# Patient Record
Sex: Male | Born: 1960 | Race: White | Hispanic: No | Marital: Single | Smoking: Former smoker
Health system: Southern US, Community
[De-identification: ages and names within clinical notes are randomized; demographics above are authoritative.]

## PROBLEM LIST (undated history)

## (undated) DIAGNOSIS — S83242D Other tear of medial meniscus, current injury, left knee, subsequent encounter: Secondary | ICD-10-CM

## (undated) DIAGNOSIS — Z789 Other specified health status: Secondary | ICD-10-CM

## (undated) HISTORY — PX: TONSILLECTOMY: SUR1361

## (undated) HISTORY — PX: KNEE CARTILAGE SURGERY: SHX688

---

## 1988-07-06 HISTORY — PX: SHOULDER ARTHROSCOPY: SHX128

## 1998-07-06 HISTORY — PX: APPENDECTOMY: SHX54

## 2003-06-30 ENCOUNTER — Emergency Department (HOSPITAL_COMMUNITY): Admission: EM | Admit: 2003-06-30 | Discharge: 2003-06-30 | Payer: Self-pay | Admitting: Emergency Medicine

## 2005-07-02 ENCOUNTER — Emergency Department (HOSPITAL_COMMUNITY): Admission: EM | Admit: 2005-07-02 | Discharge: 2005-07-02 | Payer: Self-pay | Admitting: Family Medicine

## 2011-11-13 ENCOUNTER — Other Ambulatory Visit: Payer: Self-pay | Admitting: Orthopedic Surgery

## 2011-12-03 ENCOUNTER — Encounter (HOSPITAL_BASED_OUTPATIENT_CLINIC_OR_DEPARTMENT_OTHER): Payer: Self-pay | Admitting: *Deleted

## 2011-12-03 NOTE — Progress Notes (Signed)
No meds-no ht or resp problems

## 2011-12-04 ENCOUNTER — Encounter (HOSPITAL_BASED_OUTPATIENT_CLINIC_OR_DEPARTMENT_OTHER): Payer: Self-pay | Admitting: Certified Registered"

## 2011-12-04 ENCOUNTER — Encounter (HOSPITAL_BASED_OUTPATIENT_CLINIC_OR_DEPARTMENT_OTHER): Payer: Self-pay

## 2011-12-04 ENCOUNTER — Encounter (HOSPITAL_BASED_OUTPATIENT_CLINIC_OR_DEPARTMENT_OTHER): Payer: Self-pay | Admitting: Orthopedic Surgery

## 2011-12-04 ENCOUNTER — Encounter (HOSPITAL_BASED_OUTPATIENT_CLINIC_OR_DEPARTMENT_OTHER)
Admission: RE | Disposition: A | Discharge status: Home / Self Care | Payer: Self-pay | Source: Ambulatory Visit | Attending: Orthopedic Surgery

## 2011-12-04 ENCOUNTER — Ambulatory Visit (HOSPITAL_BASED_OUTPATIENT_CLINIC_OR_DEPARTMENT_OTHER)
Admission: RE | Admit: 2011-12-04 | Discharge: 2011-12-04 | Disposition: A | Discharge status: Home / Self Care | Payer: 59 | Source: Ambulatory Visit | Attending: Orthopedic Surgery | Admitting: Orthopedic Surgery

## 2011-12-04 ENCOUNTER — Ambulatory Visit (HOSPITAL_BASED_OUTPATIENT_CLINIC_OR_DEPARTMENT_OTHER): Payer: 59 | Admitting: Certified Registered"

## 2011-12-04 DIAGNOSIS — M23205 Derangement of unspecified medial meniscus due to old tear or injury, unspecified knee: Secondary | ICD-10-CM | POA: Insufficient documentation

## 2011-12-04 DIAGNOSIS — S83242D Other tear of medial meniscus, current injury, left knee, subsequent encounter: Secondary | ICD-10-CM | POA: Diagnosis present

## 2011-12-04 HISTORY — DX: Other tear of medial meniscus, current injury, left knee, subsequent encounter: S83.242D

## 2011-12-04 HISTORY — DX: Other specified health status: Z78.9

## 2011-12-04 SURGERY — ARTHROSCOPY, KNEE, WITH MEDIAL MENISCECTOMY
Anesthesia: General | Site: Knee | Laterality: Left | Wound class: Clean

## 2011-12-04 MED ORDER — LACTATED RINGERS IV SOLN
INTRAVENOUS | Status: DC
  Administered 2011-12-04: 07:00:00 via INTRAVENOUS

## 2011-12-04 MED ORDER — OXYCODONE-ACETAMINOPHEN 5-325 MG PO TABS
1.0000 | ORAL_TABLET | Freq: Once | ORAL | Status: AC
  Administered 2011-12-04: 1 via ORAL

## 2011-12-04 MED ORDER — FENTANYL CITRATE 0.05 MG/ML IJ SOLN
INTRAMUSCULAR | Status: DC | PRN
  Administered 2011-12-04: 100 ug via INTRAVENOUS
  Administered 2011-12-04: 25 ug via INTRAVENOUS

## 2011-12-04 MED ORDER — HYDROMORPHONE HCL PF 1 MG/ML IJ SOLN
0.2500 mg | INTRAMUSCULAR | Status: DC | PRN
  Administered 2011-12-04 (×2): 0.25 mg via INTRAVENOUS

## 2011-12-04 MED ORDER — OXYCODONE-ACETAMINOPHEN 5-325 MG PO TABS: 1.0000 | ORAL_TABLET | Freq: Four times a day (QID) | ORAL | Status: AC | PRN

## 2011-12-04 MED ORDER — ONDANSETRON HCL 4 MG/2ML IJ SOLN: 4.0000 mg | Freq: Once | INTRAMUSCULAR | Status: DC | PRN

## 2011-12-04 MED ORDER — PROMETHAZINE HCL 25 MG PO TABS: 25.0000 mg | ORAL_TABLET | Freq: Four times a day (QID) | ORAL | Status: DC | PRN

## 2011-12-04 MED ORDER — SODIUM CHLORIDE 0.9 % IR SOLN
Status: DC | PRN
  Administered 2011-12-04: 6000 mL

## 2011-12-04 MED ORDER — PROPOFOL 10 MG/ML IV EMUL
INTRAVENOUS | Status: DC | PRN
  Administered 2011-12-04: 170 mg via INTRAVENOUS

## 2011-12-04 MED ORDER — DEXAMETHASONE SODIUM PHOSPHATE 4 MG/ML IJ SOLN
INTRAMUSCULAR | Status: DC | PRN
  Administered 2011-12-04: 8 mg via INTRAVENOUS

## 2011-12-04 MED ORDER — LIDOCAINE HCL (CARDIAC) 20 MG/ML IV SOLN
INTRAVENOUS | Status: DC | PRN
  Administered 2011-12-04: 80 mg via INTRAVENOUS

## 2011-12-04 MED ORDER — MIDAZOLAM HCL 5 MG/5ML IJ SOLN
INTRAMUSCULAR | Status: DC | PRN
  Administered 2011-12-04: 2 mg via INTRAVENOUS

## 2011-12-04 MED ORDER — OXYCODONE HCL 5 MG PO TABS: 5.0000 mg | ORAL_TABLET | Freq: Once | ORAL | Status: DC | PRN

## 2011-12-04 MED ORDER — KETOROLAC TROMETHAMINE 10 MG PO TABS: 10.0000 mg | ORAL_TABLET | Freq: Four times a day (QID) | ORAL | Status: AC | PRN

## 2011-12-04 MED ORDER — BUPIVACAINE HCL (PF) 0.5 % IJ SOLN
INTRAMUSCULAR | Status: DC | PRN
  Administered 2011-12-04: 20 mL via INTRA_ARTICULAR

## 2011-12-04 MED ORDER — ONDANSETRON HCL 4 MG/2ML IJ SOLN
INTRAMUSCULAR | Status: DC | PRN
  Administered 2011-12-04: 4 mg via INTRAVENOUS

## 2011-12-04 MED ORDER — CEFAZOLIN SODIUM 1-5 GM-% IV SOLN
1.0000 g | INTRAVENOUS | Status: AC
  Administered 2011-12-04: 1 g via INTRAVENOUS

## 2011-12-04 SURGICAL SUPPLY — 51 items
BANDAGE ELASTIC 6 VELCRO ST LF (GAUZE/BANDAGES/DRESSINGS) ×2 IMPLANT
BANDAGE ESMARK 6X9 LF (GAUZE/BANDAGES/DRESSINGS) IMPLANT
BENZOIN TINCTURE PRP APPL 2/3 (GAUZE/BANDAGES/DRESSINGS) ×2 IMPLANT
BLADE CUDA 5.5 (BLADE) IMPLANT
BLADE CUDA GRT WHITE 3.5 (BLADE) IMPLANT
BLADE CUDA SHAVER 3.5 (BLADE) IMPLANT
BLADE CUTTER GATOR 3.5 (BLADE) ×2 IMPLANT
BLADE CUTTER MENIS 5.5 (BLADE) IMPLANT
BLADE GREAT WHITE 4.2 (BLADE) IMPLANT
BNDG ESMARK 6X9 LF (GAUZE/BANDAGES/DRESSINGS)
CANISTER OMNI JUG 16 LITER (MISCELLANEOUS) ×2 IMPLANT
CANISTER SUCTION 2500CC (MISCELLANEOUS) IMPLANT
CLOTH BEACON ORANGE TIMEOUT ST (SAFETY) ×2 IMPLANT
CUFF TOURNIQUET SINGLE 34IN LL (TOURNIQUET CUFF) IMPLANT
CUTTER KNOT PUSHER 2-0 FIBERWI (INSTRUMENTS) IMPLANT
CUTTER MENISCUS  4.2MM (BLADE)
CUTTER MENISCUS 4.2MM (BLADE) IMPLANT
DRAPE ARTHROSCOPY W/POUCH 90 (DRAPES) ×2 IMPLANT
DRAPE SURG 17X23 STRL (DRAPES) ×2 IMPLANT
DURAPREP 26ML APPLICATOR (WOUND CARE) ×2 IMPLANT
ELECT MENISCUS 165MM 90D (ELECTRODE) IMPLANT
ELECT REM PT RETURN 9FT ADLT (ELECTROSURGICAL)
ELECTRODE REM PT RTRN 9FT ADLT (ELECTROSURGICAL) IMPLANT
GLOVE BIO SURGEON STRL SZ 6 (GLOVE) ×2 IMPLANT
GLOVE BIO SURGEON STRL SZ 6.5 (GLOVE) ×4 IMPLANT
GLOVE BIO SURGEON STRL SZ8 (GLOVE) ×2 IMPLANT
GLOVE BIOGEL PI IND STRL 7.0 (GLOVE) ×2 IMPLANT
GLOVE BIOGEL PI IND STRL 8 (GLOVE) ×2 IMPLANT
GLOVE BIOGEL PI INDICATOR 7.0 (GLOVE) ×2
GLOVE BIOGEL PI INDICATOR 8 (GLOVE) ×2
GLOVE INDICATOR 6.5 STRL GRN (GLOVE) ×2 IMPLANT
GLOVE ORTHO TXT STRL SZ7.5 (GLOVE) ×2 IMPLANT
GOWN PREVENTION PLUS XLARGE (GOWN DISPOSABLE) IMPLANT
GOWN PREVENTION PLUS XXLARGE (GOWN DISPOSABLE) IMPLANT
HOLDER KNEE FOAM BLUE (MISCELLANEOUS) ×2 IMPLANT
KNEE WRAP E Z 3 GEL PACK (MISCELLANEOUS) ×2 IMPLANT
NEEDLE MENISCAL REPAIR DBL ARM (NEEDLE) IMPLANT
NEEDLE MENISCAL REPAIR W/EYELT (NEEDLE) IMPLANT
PACK ARTHROSCOPY DSU (CUSTOM PROCEDURE TRAY) ×2 IMPLANT
PACK BASIN DAY SURGERY FS (CUSTOM PROCEDURE TRAY) ×2 IMPLANT
PENCIL BUTTON HOLSTER BLD 10FT (ELECTRODE) IMPLANT
SET ARTHROSCOPY TUBING (MISCELLANEOUS) ×1
SET ARTHROSCOPY TUBING LN (MISCELLANEOUS) ×1 IMPLANT
SLEEVE SCD COMPRESS KNEE MED (MISCELLANEOUS) ×2 IMPLANT
SPONGE GAUZE 4X4 12PLY (GAUZE/BANDAGES/DRESSINGS) ×2 IMPLANT
STRIP CLOSURE SKIN 1/2X4 (GAUZE/BANDAGES/DRESSINGS) ×2 IMPLANT
SUT MNCRL AB 4-0 PS2 18 (SUTURE) ×2 IMPLANT
TOWEL OR 17X24 6PK STRL BLUE (TOWEL DISPOSABLE) IMPLANT
TOWEL OR NON WOVEN STRL DISP B (DISPOSABLE) IMPLANT
WAND STAR VAC 90 (SURGICAL WAND) IMPLANT
WATER STERILE IRR 1000ML POUR (IV SOLUTION) ×2 IMPLANT

## 2011-12-04 NOTE — Anesthesia Procedure Notes (Addendum)
Performed by: Verlan Friends   Procedure Name: LMA Insertion Date/Time: 12/04/2011 7:41 AM Performed by: Verlan Friends Pre-anesthesia Checklist: Patient identified, Emergency Drugs available, Suction available, Patient being monitored and Timeout performed Patient Re-evaluated:Patient Re-evaluated prior to inductionOxygen Delivery Method: Circle System Utilized Preoxygenation: Pre-oxygenation with 100% oxygen Intubation Type: IV induction Ventilation: Mask ventilation without difficulty LMA: LMA inserted LMA Size: 4.0 Number of attempts: 1 Airway Equipment and Method: bite block Placement Confirmation: positive ETCO2 Tube secured with: Tape Dental Injury: Teeth and Oropharynx as per pre-operative assessment

## 2011-12-04 NOTE — Transfer of Care (Signed)
Immediate Anesthesia Transfer of Care Note  Patient: Adam Myers  Procedure(s) Performed: Procedure(s) (LRB): KNEE ARTHROSCOPY WITH MEDIAL MENISECTOMY (Left)  Patient Location: PACU  Anesthesia Type: General  Level of Consciousness: awake, alert , oriented and patient cooperative  Airway & Oxygen Therapy: Patient Spontanous Breathing and Patient connected to face mask oxygen  Post-op Assessment: Report given to PACU RN and Post -op Vital signs reviewed and stable  Post vital signs: Reviewed and stable  Complications: No apparent anesthesia complications

## 2011-12-04 NOTE — Anesthesia Preprocedure Evaluation (Signed)
Anesthesia Evaluation  Patient identified by MRN, date of birth, ID band Patient awake    Reviewed: Allergy & Precautions, H&P , NPO status , Patient's Chart, lab work & pertinent test results  Airway Mallampati: I      Dental  (+) Teeth Intact   Pulmonary  breath sounds clear to auscultation        Cardiovascular Rhythm:Regular Rate:Normal     Neuro/Psych    GI/Hepatic   Endo/Other    Renal/GU      Musculoskeletal   Abdominal   Peds  Hematology   Anesthesia Other Findings   Reproductive/Obstetrics                           Anesthesia Physical Anesthesia Plan  ASA: I  Anesthesia Plan: General   Post-op Pain Management:    Induction: Intravenous  Airway Management Planned: LMA  Additional Equipment:   Intra-op Plan:   Post-operative Plan:   Informed Consent: I have reviewed the patients History and Physical, chart, labs and discussed the procedure including the risks, benefits and alternatives for the proposed anesthesia with the patient or authorized representative who has indicated his/her understanding and acceptance.   Dental advisory given  Plan Discussed with:   Anesthesia Plan Comments: (Plan GA with LMA  Kipp Brood, MD)        Anesthesia Quick Evaluation

## 2011-12-04 NOTE — Discharge Instructions (Signed)
Meniscus Injury of the Knee, Arthroscopy You may have an internal derangement of the knee. This means something is wrong inside the knee. Your caregiver can make a more accurate diagnosis (learning what is wrong) by performing an arthroscopic procedure. Your knee has two layers of cartilage. Articular cartilage covers the bone ends. It lets your knee bend and move smoothly. Two menisci (thick pads of cartilage that form a rim inside the joint) help absorb shock. They stabilize your knee. Ligaments bind the bones together. They support your knee joint. Muscles move the joint, help support your knee, and take stress off the joint itself.  ABOUT THE PROCEDURE Arthroscopy is a surgical technique. It allows your orthopedic surgeon to diagnose and treat your knee injury with accuracy. The surgeon looks into your knee through a small scope. The scope is like a small (pencil-sized) telescope. Arthroscopy is less invasive than open knee surgery. You can expect a more rapid recovery. Following your caregiver's instructions will help you recover rapidly and completely. Use crutches, rest, elevate, ice, and do knee exercises as instructed. The length of recovery depends on various factors. These factors include type of injury, age, physical condition, medical conditions, and your determination. How long you will be away from your normal activities will depend on what kind of knee problem you have. It will also depend on how much tissue is damaged. Rebuilding your muscles after arthroscopy helps ensure a full recovery. RECOVERY Recovery after a meniscus injury depends on how much meniscus is damaged. It also depends on whether or not you have damaged other knee tissue. With small tears, your recovery may take a couple weeks. Larger tears will take longer. Meniscus injuries can usually be treated during arthroscopy. If your injury is on the inner edge of the meniscus, your surgeon may trim the meniscus back to a smooth rim.  In other cases, your surgeon will try to repair a damaged meniscus with sutures (stitches). This may lengthen your rehabilitation. It may provide better long-term health by helping your knee retain its shock absorption abilities. Use crutches, limit weight bearing, rest, elevate, apply ice, and exercise your knee as instructed. If a brace is applied, use as directed. The length of recovery depends on various factors including type of injury, age, physical condition, other medical conditions, and your determination. Your caregiver will help with instructions for rehabilitation of your knee. HOME CARE INSTRUCTIONS  Use crutches and knee exercises as instructed.   Applying an ice pack to your operative site may help with discomfort. It may also keep the swelling down.   Only take over-the-counter or prescription medicines for pain, discomfort, inflammation (soreness)or fever as directed by your caregiver. You may use these only if your caregiver has not given medications that would interfere.   You may resume normal diet and activities as directed.  SEEK MEDICAL ATTENTION IF:  There is increased bleeding (more than a small spot) from the wound.   You notice redness, swelling, or increasing pain in the wound.   Pus is coming from wound.   An unexplained oral temperature above 102 F (38.9 C) develops, or as your caregiver suggests.   You notice a foul smell coming from the wound or dressing.  SEEK IMMEDIATE MEDICAL CARE IF:  You develop a rash.   You have difficulty breathing.   You have any allergic problems.  Document Released: 06/19/2000 Document Revised: 06/11/2011 Document Reviewed: 09/05/2007 [facility] Patient Information 2012 [place], [place].   Post Anesthesia Home Care Instructions  Activity: Get plenty of rest for the remainder of the day. A responsible adult should stay with you for 24 hours following the procedure.  For the next 24 hours, DO NOT: -Drive a car -Social worker -Drink alcoholic beverages -Take any medication unless instructed by your physician -Make any legal decisions or sign important papers.  Meals: Start with liquid foods such as gelatin or soup. Progress to regular foods as tolerated. Avoid greasy, spicy, heavy foods. If nausea and/or vomiting occur, drink only clear liquids until the nausea and/or vomiting subsides. Call your physician if vomiting continues.  Special Instructions/Symptoms: Your throat may feel dry or sore from the anesthesia or the breathing tube placed in your throat during surgery. If this causes discomfort, gargle with warm salt water. The discomfort should disappear within 24 hours.

## 2011-12-04 NOTE — Op Note (Signed)
12/04/2011  8:34 AM  PATIENT:  Adam Myers    PRE-OPERATIVE DIAGNOSIS:  left knee medial meniscus tear  POST-OPERATIVE DIAGNOSIS:  Same  PROCEDURE:  KNEE ARTHROSCOPY WITH MEDIAL MENISECTOMY  SURGEON:  Eulas Post, MD  PHYSICIAN ASSISTANT: Janace Litten, OPA-C, present and scrubbed throughout the case, critical for completion in a timely fashion, and for retraction, instrumentation, and closure.  ANESTHESIA:   General  PREOPERATIVE INDICATIONS:  Adam Myers is a  51 y.o. male with a diagnosis of left knee medial meniscus tear who failed conservative measures and elected for surgical management.  He had a bucket handle locked meniscus tear. He had preoperative loss of extension.  The risks benefits and alternatives were discussed with the patient preoperatively including but not limited to the risks of infection, bleeding, nerve injury, cardiopulmonary complications, the need for revision surgery, among others, and the patient was willing to proceed.  OPERATIVE IMPLANTS: None  OPERATIVE FINDINGS: Bucket handle medial meniscus tear. This involved at least 50% of the meniscus. The patellofemoral joint was essentially normal. The medial and lateral condyles were normal on both the tibial and femoral side. The anterior cruciate ligament and PCL was intact. The medial and lateral gutters were normal. The lateral meniscus was normal.  OPERATIVE PROCEDURE: The patient was brought to the operating room and placed in the supine position. General anesthesia was administered. Time out was performed. The left lower extremity was prepped and draped in usual sterile fashion. Diagnostic arthroscopy was carried out the above-named findings. I used the arthroscopic biter to release the meniscus posteriorly, and then used a biter anteriorly after debriding the meniscus to a smaller size. I was then able to remove the meniscus through the medial portal. I did switch portals to facilitate access during  the meniscal release. I debrided the remnant portion of the meniscus back to a stable rim using the arthroscopic biter and the shaver.  After complete removal of the torn portion of the meniscus was achieved I completed the diagnostic arthroscopy and irrigated the knee copiously and drained the knee, injected it, and then closed the portals with Monocryl followed by Steri-Strips and sterile gauze. He was awakened and extubated and returned to the PACU in stable and satisfactory condition. There were no complications and he tolerated the procedure well.

## 2011-12-04 NOTE — Anesthesia Postprocedure Evaluation (Signed)
  Anesthesia Post-op Note  Patient: Adam Myers  Procedure(s) Performed: Procedure(s) (LRB): KNEE ARTHROSCOPY WITH MEDIAL MENISECTOMY (Left)  Patient Location: PACU  Anesthesia Type: General  Level of Consciousness: awake, alert  and oriented  Airway and Oxygen Therapy: Patient Spontanous Breathing and Patient connected to nasal cannula oxygen  Post-op Pain: mild  Post-op Assessment: Post-op Vital signs reviewed and Patient's Cardiovascular Status Stable  Post-op Vital Signs: stable  Complications: No apparent anesthesia complications

## 2011-12-04 NOTE — H&P (Signed)
  PREOPERATIVE H&P  Chief Complaint: left knee mmt  HPI: Adam Myers is a 51 y.o. male who presents for preoperative history and physical with a diagnosis of left knee medial meniscus tear. Symptoms are rated as moderate to severe, and have been worsening.  This is significantly impairing activities of daily living.  He has elected for surgical management.   Past Medical History  Diagnosis Date  . No pertinent past medical history    Past Surgical History  Procedure Date  . Shoulder arthroscopy 1990    right  . Tonsillectomy   . Appendectomy 2000   History   Social History  . Marital Status: Single    Spouse Name: N/A    Number of Children: N/A  . Years of Education: N/A   Social History Main Topics  . Smoking status: Former Smoker    Quit date: 12/02/2008  . Smokeless tobacco: None  . Alcohol Use: Yes     occ  . Drug Use: No  . Sexually Active:    Other Topics Concern  . None   Social History Narrative  . None   History reviewed. No pertinent family history. No Known Allergies Prior to Admission medications   Medication Sig Start Date End Date Taking? Authorizing Provider  fish oil-omega-3 fatty acids 1000 MG capsule Take 1 g by mouth daily.   Yes Historical Provider, MD     Positive ROS: All other systems have been reviewed and were otherwise negative with the exception of those mentioned in the HPI and as above.  Physical Exam: General: Alert, no acute distress Cardiovascular: No pedal edema Respiratory: No cyanosis, no use of accessory musculature GI: No organomegaly, abdomen is soft and non-tender Skin: No lesions in the area of chief complaint Neurologic: Sensation intact distally Psychiatric: Patient is competent for consent with normal mood and affect Lymphatic: No axillary or cervical lymphadenopathy  MUSCULOSKELETAL: left knee medial joint line tenderness.  Moderate effusion.  Assessment: left knee dislocation, mmt  Plan: Plan for  Procedure(s): KNEE ARTHROSCOPY WITH MEDIAL MENISECTOMY  The risks benefits and alternatives were discussed with the patient including but not limited to the risks of nonoperative treatment, versus surgical intervention including infection, bleeding, nerve injury,  blood clots, cardiopulmonary complications, morbidity, mortality, among others, and they were willing to proceed.   Eulas Post, MD 12/04/2011 7:25 AM

## 2012-06-10 ENCOUNTER — Ambulatory Visit (INDEPENDENT_AMBULATORY_CARE_PROVIDER_SITE_OTHER): Payer: 59 | Admitting: Family Medicine

## 2012-06-10 ENCOUNTER — Encounter: Payer: Self-pay | Admitting: Family Medicine

## 2012-06-10 VITALS — BP 110/79 | HR 68 | Temp 97.0°F | Resp 17 | Ht 66.5 in | Wt 163.2 lb

## 2012-06-10 DIAGNOSIS — Z23 Encounter for immunization: Secondary | ICD-10-CM

## 2012-06-10 DIAGNOSIS — Z0000 Encounter for general adult medical examination without abnormal findings: Secondary | ICD-10-CM

## 2012-06-10 LAB — CBC
HCT: 45.5 % (ref 39.0–52.0)
Hemoglobin: 15.6 g/dL (ref 13.0–17.0)
MCH: 31.5 pg (ref 26.0–34.0)
MCHC: 34.3 g/dL (ref 30.0–36.0)
MCV: 91.7 fL (ref 78.0–100.0)
Platelets: 325 10*3/uL (ref 150–400)
RBC: 4.96 MIL/uL (ref 4.22–5.81)
RDW: 13.9 % (ref 11.5–15.5)
WBC: 6.3 10*3/uL (ref 4.0–10.5)

## 2012-06-10 LAB — COMPREHENSIVE METABOLIC PANEL
ALT: 15 U/L (ref 0–53)
AST: 16 U/L (ref 0–37)
Albumin: 4.6 g/dL (ref 3.5–5.2)
Alkaline Phosphatase: 60 U/L (ref 39–117)
BUN: 11 mg/dL (ref 6–23)
CO2: 23 mEq/L (ref 19–32)
Calcium: 9.3 mg/dL (ref 8.4–10.5)
Chloride: 104 mEq/L (ref 96–112)
Creat: 0.89 mg/dL (ref 0.50–1.35)
Glucose, Bld: 81 mg/dL (ref 70–99)
Potassium: 4.1 mEq/L (ref 3.5–5.3)
Sodium: 136 mEq/L (ref 135–145)
Total Bilirubin: 0.4 mg/dL (ref 0.3–1.2)
Total Protein: 6.8 g/dL (ref 6.0–8.3)

## 2012-06-10 LAB — POCT URINALYSIS DIPSTICK
Bilirubin, UA: NEGATIVE
Blood, UA: NEGATIVE
Glucose, UA: NEGATIVE
Ketones, UA: NEGATIVE
Leukocytes, UA: NEGATIVE
Nitrite, UA: NEGATIVE
Protein, UA: NEGATIVE
Spec Grav, UA: 1.015
Urobilinogen, UA: 0.2
pH, UA: 5.5

## 2012-06-10 LAB — LIPID PANEL
Cholesterol: 155 mg/dL (ref 0–200)
HDL: 51 mg/dL (ref >39)
LDL Cholesterol: 96 mg/dL (ref 0–99)
Total CHOL/HDL Ratio: 3 Ratio
Triglycerides: 42 mg/dL (ref <150)
VLDL: 8 mg/dL (ref 0–40)

## 2012-06-10 LAB — PSA: PSA: 2 ng/mL (ref <=4.00)

## 2012-06-10 LAB — TSH: TSH: 1.951 u[IU]/mL (ref 0.350–4.500)

## 2012-06-10 LAB — IFOBT (OCCULT BLOOD): IFOBT: NEGATIVE

## 2012-06-10 NOTE — Progress Notes (Signed)
@[email or web address] @  Patient ID: Adam Myers MRN: [id number], DOB: 12/08/1960 51 y.o. Date of Encounter: 06/10/2012, 12:35 PM  Primary Physician: Elvina Sidle, MD  Chief Complaint: Physical (CPE)  HPI: 51 y.o. y/o male with history noted below here for CPE.  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 He has 2 boys age 88 and 16 cc on a regular basis, changing his flight schedule to accommodate the settlement with his ex.  Patient has had some fatigue, although given his twice a week flight schedule overseas in stress from his fracture, this may be all attributable.  Is been seeing Dr. 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 The injection seemed to be helping.  We discussed the use of aspirin in prevention of thromboembolic phenomenon when he is flying such long distances.  Last CPE 04/02/11 Has not rec'd flu shot this year  Review of Systems: Consitutional: No fever, chills, night sweats, lymphadenopathy, or weight changes. Eyes: No visual changes, eye redness, or discharge. ENT/Mouth: Ears: No otalgia, tinnitus, hearing loss, discharge. Nose: No congestion, rhinorrhea, sinus pain, or epistaxis. Throat: No sore throat, post nasal drip, or teeth pain. Cardiovascular: No CP, palpitations, diaphoresis, DOE, edema, orthopnea, PND. Respiratory: No cough, hemoptysis, SOB, or wheezing. Gastrointestinal: No anorexia, dysphagia, reflux, pain, nausea, vomiting, hematemesis, diarrhea, constipation, BRBPR, or melena. Genitourinary: No dysuria, frequency, urgency, hematuria, incontinence, nocturia, decreased urinary stream, discharge, impotence, or testicular pain/masses. Musculoskeletal: No decreased ROM, myalgias, stiffness, joint swelling, or weakness. Skin: No rash, erythema, lesion changes, pain, warmth, jaundice, or pruritis. Neurological: No headache, dizziness, syncope,  seizures, tremors, memory loss, coordination problems, or paresthesias. Psychological: No anxiety, depression, hallucinations, SI/HI. Endocrine: No fatigue, polydipsia, polyphagia, polyuria, or known diabetes. All other systems were reviewed and are otherwise negative.  Past Medical History  Diagnosis Date  . No pertinent past medical history   . Tear, knee, medial meniscus, left, subsequent encounter 12/04/2011     Past Surgical History  Procedure Date  . Shoulder arthroscopy 1990    right  . Tonsillectomy   . Appendectomy 2000    Home Meds:  Prior to Admission medications   Medication Sig Start Date End Date Taking? Authorizing Provider  fish oil-omega-3 fatty acids 1000 MG capsule Take 1 g by mouth daily.    Historical Provider, MD  promethazine (PHENERGAN) 25 MG tablet Take 1 tablet (25 mg total) by mouth every 6 (six) hours as needed for nausea. 12/04/11 12/11/11  Eulas Post, MD    Allergies: No Known Allergies  History   Social History  . Marital Status: Single    Spouse Name: N/A    Number of Children: N/A  . Years of Education: N/A   Occupational History  . Not on file.   Social History Main Topics  . Smoking status: Former Smoker -- 1.0 packs/day    Types: Cigarettes    Quit date: 12/02/2008  . Smokeless tobacco: Not on file     Comment: off and on quit years ago  . Alcohol Use: Yes     Comment: occ  . Drug Use: No  . Sexually Active: Not on file   Other Topics Concern  . Not on file   Social History Narrative  . No narrative on file    No family history on file.  Physical Exam: Blood pressure 110/79, pulse 68, temperature 97 F (36.1 C), temperature source Oral,  resp. rate 17, height 5' 6.5" (1.689 m), weight 163 lb 3.2 oz (74.027 kg).  General: Well developed, well nourished, in no acute distress. HEENT: Normocephalic, atraumatic. Conjunctiva pink, sclera non-icteric. Pupils 2 mm constricting to 1 mm, round, regular, and equally reactive to  light and accomodation. EOMI. Internal auditory canal clear. TMs with good cone of light and without pathology. Nasal mucosa pink. Nares are without discharge. No sinus tenderness. Oral mucosa pink. Dentition good . Pharynx without exudate.   Neck: Supple. Trachea midline. No thyromegaly. Full ROM. No lymphadenopathy. Lungs: Clear to auscultation bilaterally without wheezes, rales, or rhonchi. Breathing is of normal effort and unlabored. Cardiovascular: RRR with S1 S2. No murmurs, rubs, or gallops appreciated. Distal pulses 2+ symmetrically. No carotid or abdominal bruits Abdomen: Soft, non-tender, non-distended with normoactive bowel sounds. No hepatosplenomegaly or masses. No rebound/guarding. No CVA tenderness. Without hernias.  Rectal: No external hemorrhoids or fissures. Rectal vault without masses.  Genitourinary:  circumcised male. No penile lesions. Testes descended bilaterally, and smooth without tenderness or masses.  Musculoskeletal: Full range of motion and 5/5 strength throughout. Without swelling, atrophy, tenderness, crepitus, or warmth. Extremities without clubbing, cyanosis, or edema. Calves supple. Skin: Warm and moist without erythema, ecchymosis, wounds, or rash. Neuro: A+Ox3. CN II-XII grossly intact. Moves all extremities spontaneously. Full sensation throughout. Normal gait. DTR 2+ throughout upper and lower extremities. Finger to nose intact. Psych:  Responds to questions appropriately with a normal affect.    Assessment/Plan:  51 y.o. y/o  male here for CPE 1. Routine general medical examination at a health care facility  POCT urinalysis dipstick, Ambulatory referral to Gastroenterology, IFOBT POC (occult bld, rslt in office), CBC, Comprehensive metabolic panel, HIV antibody, Lipid panel, GC/chlamydia probe amp, urine, PSA, TSH, RPR  2. Needs flu shot  Flu vaccine greater than or equal to 3yo preservative free IM    -  Signed, Elvina Sidle, MD 06/10/2012 12:35  PM

## 2012-06-11 LAB — GC/CHLAMYDIA PROBE AMP, URINE
Chlamydia, Swab/Urine, PCR: NEGATIVE
GC Probe Amp, Urine: NEGATIVE

## 2012-06-11 LAB — HIV ANTIBODY (ROUTINE TESTING W REFLEX): HIV: NONREACTIVE

## 2012-06-11 LAB — RPR

## 2012-07-25 ENCOUNTER — Other Ambulatory Visit: Payer: Self-pay | Admitting: Gastroenterology

## 2012-08-04 ENCOUNTER — Encounter: Payer: Self-pay | Admitting: Family Medicine

## 2012-08-11 ENCOUNTER — Encounter (HOSPITAL_COMMUNITY): Payer: Self-pay | Admitting: Pharmacy Technician

## 2012-08-19 ENCOUNTER — Encounter (HOSPITAL_COMMUNITY): Admission: RE | Payer: Self-pay | Source: Ambulatory Visit

## 2012-08-19 ENCOUNTER — Ambulatory Visit (HOSPITAL_COMMUNITY): Admission: RE | Admit: 2012-08-19 | Payer: 59 | Source: Ambulatory Visit | Admitting: Gastroenterology

## 2012-08-19 SURGERY — COLONOSCOPY
Anesthesia: Moderate Sedation

## 2012-08-30 ENCOUNTER — Other Ambulatory Visit: Payer: Self-pay | Admitting: Gastroenterology

## 2012-09-27 ENCOUNTER — Other Ambulatory Visit: Payer: Self-pay | Admitting: Family Medicine

## 2012-09-27 ENCOUNTER — Telehealth: Payer: Self-pay

## 2012-09-27 NOTE — Telephone Encounter (Signed)
If you order it, I will sign it.  I tried to do a future order but I could not complete the order because I do not know when he can come in

## 2012-09-27 NOTE — Telephone Encounter (Signed)
DR L, PT NEEDS A TETANUS SHOT FOR [employer] REQUIREMENTS. YOU DID A CPE IN DEC. CAN YOU PUT IN AN ORDER FOR A TETANUS SO HE DOES NOT HAVE TO WAIT TO SEE A DR FOR THE SHOT?  PLEASE CONTACT PT IF THIS IS POSSIBLE SO HE WILL KNOW.  [phone]

## 2012-09-29 ENCOUNTER — Ambulatory Visit (INDEPENDENT_AMBULATORY_CARE_PROVIDER_SITE_OTHER): Payer: 59 | Admitting: Family Medicine

## 2012-09-29 ENCOUNTER — Other Ambulatory Visit: Payer: Self-pay

## 2012-09-29 VITALS — BP 108/78 | Temp 98.2°F

## 2012-09-29 DIAGNOSIS — Z23 Encounter for immunization: Secondary | ICD-10-CM

## 2012-09-29 NOTE — Progress Notes (Signed)
  Subjective:    Patient ID: Adam Myers, male    DOB: May 28, 1961, 52 y.o.   MRN: [id number]  HPI    Review of Systems     Objective:   Physical Exam        Assessment & Plan:

## 2012-09-29 NOTE — Progress Notes (Signed)
Patient here for Tdap only per Dr. Milus Glazier

## 2012-10-28 ENCOUNTER — Encounter (HOSPITAL_COMMUNITY)
Admission: RE | Disposition: A | Discharge status: Home / Self Care | Payer: Self-pay | Source: Ambulatory Visit | Attending: Gastroenterology

## 2012-10-28 ENCOUNTER — Ambulatory Visit (HOSPITAL_COMMUNITY)
Admission: RE | Admit: 2012-10-28 | Discharge: 2012-10-28 | Disposition: A | Discharge status: Home / Self Care | Payer: 59 | Source: Ambulatory Visit | Attending: Gastroenterology | Admitting: Gastroenterology

## 2012-10-28 ENCOUNTER — Encounter (HOSPITAL_COMMUNITY): Payer: Self-pay | Admitting: *Deleted

## 2012-10-28 DIAGNOSIS — D126 Benign neoplasm of colon, unspecified: Secondary | ICD-10-CM | POA: Insufficient documentation

## 2012-10-28 DIAGNOSIS — K648 Other hemorrhoids: Secondary | ICD-10-CM | POA: Insufficient documentation

## 2012-10-28 DIAGNOSIS — Z1211 Encounter for screening for malignant neoplasm of colon: Secondary | ICD-10-CM | POA: Insufficient documentation

## 2012-10-28 DIAGNOSIS — K5730 Diverticulosis of large intestine without perforation or abscess without bleeding: Secondary | ICD-10-CM | POA: Insufficient documentation

## 2012-10-28 DIAGNOSIS — K644 Residual hemorrhoidal skin tags: Secondary | ICD-10-CM | POA: Insufficient documentation

## 2012-10-28 HISTORY — PX: COLONOSCOPY: SHX5424

## 2012-10-28 SURGERY — COLONOSCOPY
Anesthesia: Moderate Sedation

## 2012-10-28 MED ORDER — DIPHENHYDRAMINE HCL 50 MG/ML IJ SOLN
INTRAMUSCULAR | Status: AC
  Filled 2012-10-28: qty 1

## 2012-10-28 MED ORDER — MIDAZOLAM HCL 5 MG/5ML IJ SOLN
INTRAMUSCULAR | Status: DC | PRN
  Administered 2012-10-28 (×4): 2.5 mg via INTRAVENOUS

## 2012-10-28 MED ORDER — SODIUM CHLORIDE 0.9 % IV SOLN
INTRAVENOUS | Status: DC
  Administered 2012-10-28: 500 mL via INTRAVENOUS

## 2012-10-28 MED ORDER — FENTANYL CITRATE 0.05 MG/ML IJ SOLN
INTRAMUSCULAR | Status: AC
  Filled 2012-10-28: qty 4

## 2012-10-28 MED ORDER — MIDAZOLAM HCL 10 MG/2ML IJ SOLN
INTRAMUSCULAR | Status: AC
  Filled 2012-10-28: qty 4

## 2012-10-28 MED ORDER — FENTANYL CITRATE 0.05 MG/ML IJ SOLN
INTRAMUSCULAR | Status: DC | PRN
  Administered 2012-10-28 (×4): 25 ug via INTRAVENOUS

## 2012-10-28 NOTE — Op Note (Signed)
[facility] [address] [place], [address]   OPERATIVE PROCEDURE REPORT  PATIENT: Adam, Myers  MR#: [id number] BIRTHDATE: [birth year]  GENDER: Male ENDOSCOPIST: Jeani Hawking, MD ASSISTANT:   Claudie Revering, RN [facility] [place]bird, technician PROCEDURE DATE: 10/28/2012 PROCEDURE:   Colonoscopy with snare polypectomy ASA CLASS:   Class I INDICATIONS:Colorectal cancer screening. MEDICATIONS: Versed 10 mg IV and Fentanyl 100 mcg IV  DESCRIPTION OF PROCEDURE:   After the risks benefits and alternatives of the procedure were thoroughly explained, informed consent was obtained.  A digital rectal exam revealed no abnormalities of the rectum.    The     endoscope was introduced through the anus  and advanced to the cecum, which was identified by both the appendix and ileocecal valve , No adverse events experienced.    The quality of the prep was excellent. .  The instrument was then slowly withdrawn as the colon was fully examined.     FINDINGS: A 4 mm sessile cecal polyp was removed with a cold snare. Three 3 mm sessile transverse colon polyps were removed with a cold snare.  Scattered sigmoid diverticula were identified. Retroflexed views revealed internal/external hemorrhoids.     The scope was then withdrawn from the patient and the procedure terminated.  COMPLICATIONS: There were no complications.  IMPRESSION: 1) Polyps. 2) Diverticula. 3) Int/Ext Hemorrhoids.  RECOMMENDATIONS: 1) Await biopsy results. 2) Repeat colonoscopy in 3-5 years.   _______________________________ eSignedJeani Hawking, MD 10/28/2012 10:55 AM

## 2012-10-28 NOTE — H&P (Signed)
Reason for Consult:Screening colonoscopy. Referring Physician: Elvina Sidle, M.D.  Adam Myers HPI: This is a 52 year old male referred for a screening colonoscopy.  The patient denies any problems with nausea, vomiting, fevers, chills, abdominal pain, diarrhea, constipation, hematochezia, melena, GERD, or dysphagia.  The patient denies any known family history of colon cancer.  Past Medical History  Diagnosis Date  . No pertinent past medical history   . Tear, knee, medial meniscus, left, subsequent encounter 12/04/2011    Past Surgical History  Procedure Laterality Date  . Shoulder arthroscopy  1990    right  . Tonsillectomy    . Appendectomy  2000  . Knee cartilage surgery      Lt knee    Family History  Problem Relation Age of Onset  . Heart disease Mother   . Emphysema Maternal Grandfather   . Emphysema Paternal Grandfather     Social History:  reports that he quit smoking about 3 years ago. His smoking use included Cigarettes. He smoked 1.00 pack per day. He does not have any smokeless tobacco history on file. He reports that  drinks alcohol. He reports that he does not use illicit drugs.  Allergies: No Known Allergies  Medications:  Scheduled:  Continuous: . sodium chloride 500 mL (10/28/12 0947)    No results found for this or any previous visit (from the past 24 hour(s)).   No results found.  ROS:  As stated above in the HPI otherwise negative.  Blood pressure 118/79, pulse 57, temperature 98.8 F (37.1 C), temperature source Oral, resp. rate 12, height 5' 6.5" (1.689 m), weight 154 lb (69.854 kg), SpO2 98.00%.    PE: Gen: NAD, Alert and Oriented HEENT:  [place]/AT, EOMI Neck: Supple, no LAD Lungs: CTA Bilaterally CV: RRR without M/G/R ABM: Soft, NTND, +BS Ext: No C/C/E  Assessment/Plan: 1) Screening colonoscopy.  Plan: 1) Colonoscopy now.  Katilin Raynes D 10/28/2012, 10:01 AM

## 2012-10-31 ENCOUNTER — Encounter (HOSPITAL_COMMUNITY): Payer: Self-pay | Admitting: Gastroenterology

## 2013-03-16 ENCOUNTER — Telehealth: Payer: Self-pay

## 2013-03-16 NOTE — Telephone Encounter (Signed)
Medical Records: patient needs a copy of his latest lab results mailed to him at your earliest convenience.  Thank you.  His number is [phone] if you have any questions.

## 2013-03-16 NOTE — Telephone Encounter (Signed)
Will you do this? I am sorry I would normally do this, but my computer wont print hopefully this will be fixed soon

## 2013-03-20 NOTE — Telephone Encounter (Signed)
Done and mailed

## 2013-05-11 ENCOUNTER — Other Ambulatory Visit: Payer: Self-pay

## 2013-06-22 ENCOUNTER — Encounter: Payer: Self-pay | Admitting: Family Medicine

## 2013-06-22 ENCOUNTER — Ambulatory Visit (INDEPENDENT_AMBULATORY_CARE_PROVIDER_SITE_OTHER): Payer: 59 | Admitting: Family Medicine

## 2013-06-22 VITALS — BP 116/76 | HR 65 | Temp 97.9°F | Resp 16

## 2013-06-22 DIAGNOSIS — H6691 Otitis media, unspecified, right ear: Secondary | ICD-10-CM

## 2013-06-22 DIAGNOSIS — H6690 Otitis media, unspecified, unspecified ear: Secondary | ICD-10-CM

## 2013-06-22 DIAGNOSIS — Z860100 Personal history of colon polyps, unspecified: Secondary | ICD-10-CM | POA: Insufficient documentation

## 2013-06-22 DIAGNOSIS — Z23 Encounter for immunization: Secondary | ICD-10-CM

## 2013-06-22 DIAGNOSIS — M720 Palmar fascial fibromatosis [Dupuytren]: Secondary | ICD-10-CM

## 2013-06-22 DIAGNOSIS — Z0000 Encounter for general adult medical examination without abnormal findings: Secondary | ICD-10-CM

## 2013-06-22 LAB — LIPID PANEL
Cholesterol: 145 mg/dL (ref 0–200)
HDL: 62 mg/dL (ref >39)
LDL Cholesterol: 76 mg/dL (ref 0–99)
Total CHOL/HDL Ratio: 2.3 Ratio
Triglycerides: 36 mg/dL (ref <150)
VLDL: 7 mg/dL (ref 0–40)

## 2013-06-22 LAB — POCT URINALYSIS DIPSTICK
Bilirubin, UA: NEGATIVE
Glucose, UA: NEGATIVE
Ketones, UA: 15
Leukocytes, UA: NEGATIVE
Nitrite, UA: NEGATIVE
Protein, UA: NEGATIVE
Spec Grav, UA: 1.02
Urobilinogen, UA: 0.2
pH, UA: 5

## 2013-06-22 LAB — COMPREHENSIVE METABOLIC PANEL
ALT: 16 U/L (ref 0–53)
AST: 21 U/L (ref 0–37)
Albumin: 4.4 g/dL (ref 3.5–5.2)
Alkaline Phosphatase: 68 U/L (ref 39–117)
BUN: 20 mg/dL (ref 6–23)
CO2: 20 mEq/L (ref 19–32)
Calcium: 9 mg/dL (ref 8.4–10.5)
Chloride: 106 mEq/L (ref 96–112)
Creat: 0.92 mg/dL (ref 0.50–1.35)
Glucose, Bld: 80 mg/dL (ref 70–99)
Potassium: 4.6 mEq/L (ref 3.5–5.3)
Sodium: 138 mEq/L (ref 135–145)
Total Bilirubin: 0.5 mg/dL (ref 0.3–1.2)
Total Protein: 6.7 g/dL (ref 6.0–8.3)

## 2013-06-22 LAB — CBC
HCT: 43.7 % (ref 39.0–52.0)
Hemoglobin: 14.8 g/dL (ref 13.0–17.0)
MCH: 31.4 pg (ref 26.0–34.0)
MCHC: 33.9 g/dL (ref 30.0–36.0)
MCV: 92.6 fL (ref 78.0–100.0)
Platelets: 277 10*3/uL (ref 150–400)
RBC: 4.72 MIL/uL (ref 4.22–5.81)
RDW: 13.7 % (ref 11.5–15.5)
WBC: 4.2 10*3/uL (ref 4.0–10.5)

## 2013-06-22 LAB — RPR

## 2013-06-22 LAB — IFOBT (OCCULT BLOOD): IFOBT: NEGATIVE

## 2013-06-22 MED ORDER — AMOXICILLIN 875 MG PO TABS: 875.0000 mg | ORAL_TABLET | Freq: Two times a day (BID) | ORAL | Status: DC

## 2013-06-22 NOTE — Progress Notes (Signed)
   Subjective:    Patient ID: Adam Myers, male    DOB: 1961/06/19, 52 y.o.   MRN: [id number]  HPI Is a 52 year old adult [employer] who comes in for his annual physical.  Or several issues: Some decreased hearing in the right ear which is chronic and thought to be your wax related.  Patient is divorced and takes care of his children every other week. This is a stressful situation because his job requires regular attendance. His 2 children are age 88 and 47.   Patient had an acute illness in October but is recovered at this point.  Patient is currently involved in relationship in [place], the city to he flies.  He would like to have STD screening.   Review of Systems  Constitutional: Negative.   HENT: Negative.   Eyes: Negative.   Respiratory: Negative.   Cardiovascular: Negative.   Gastrointestinal: Negative.   Endocrine: Negative.   Genitourinary: Negative.   Musculoskeletal: Negative.   Skin: Negative.   Allergic/Immunologic: Negative.   Neurological: Negative.   Hematological: Negative.   Psychiatric/Behavioral: Negative.        Objective:   Physical Exam No particular distress. Patient appears to be in good physical condition. HEENT: Amber fluid behind the right TM, normal fundi, normal canals, normal oropharynx Neck: Supple no adenopathy or thyromegaly, no bruits Chest: Clear Heart: Regular no murmur Abdomen: Soft nontender without HSM or masses Rectal exam: No hemorrhoids, normal rectal sphincter, small palpable nodule in the right lobe of the prostate Extremities: Good pulses, and  skin: Half centimeter dermatofibroma on the left medial upper calf, 4 mm scaly seborrheic keratosis right jaw.  The latter was cryo'd with liquid N2 Neuro: Alert, cranial nerves III through XII intact, normal gait, normal range of motion of all 4 extremities except left pinky mild flexion contracture and Dupuytren's contracture of left hand     Assessment & Plan:  Annual  physical exam - Plan: HSV(herpes simplex vrs) 1+2 ab-IgG, RPR, Comprehensive metabolic panel, Lipid panel, POCT urinalysis dipstick, CBC, IFOBT POC (occult bld, rslt in office)  Need for prophylactic vaccination and inoculation against influenza - Plan: Flu Vaccine QUAD 36+ mos IM  Otitis media, right - Plan: amoxicillin (AMOXIL) 875 MG tablet  Dupuytren's contracture of left hand  Signed, Elvina Sidle, MD

## 2013-06-22 NOTE — Patient Instructions (Signed)
Dupuytren's Contracture Dupuytren's contracture affects the fingers and the palm of the hand. This condition usually develops slowly. It may take many years to develop. The pinky finger and the ring finger are most often affected. These fingers start to curve inward, like a claw. At some point, the fingers cannot go straight anymore. This can make it hard to do things like:  Put on gloves.  Shake hands.  Grab something off a shelf. The condition usually does not cause pain and is not dangerous. The condition gets its name from the doctor who came up with an operation to fix the problem. His name was Baron Guillaume Dupuytren. Contracture means pulling inward. CAUSES  Dupuytren's contracture does not start with the fingers. It starts in the palm of the hand, under the skin. The tissue under the skin is called fascia. The fascia covers the cords (tendons) that control how the fingers move. In Dupuytren's contracture the fascia tissue becomes thick and then pulls on the cords. That causes the fingers to curl. The condition can affect both hands and any fingers, but it usually strikes one hand worse than the other. The fingers farthest from the thumb are most often the ones that curl. The cause is not clear. Some experts believe it results from an autoimmune reaction. That means the body's immune system (which fights off disease) attacks itself by mistake. What experts do know is that certain conditions and behaviors (called risk factors) make the chance of having this condition more likely. They include:  Age. Most people who have the condition are older than 50.  Sex. It affects men more often than women.  Family history. The condition tends to run in families from countries in [place].  Certain behaviors. People who smoke and drink alcohol are more apt to develop the problem.  Some other medical conditions. Having diabetes makes Dupuytren's contracture more likely. So does  having a condition that involves a seizure (when the brain's function is interrupted). SYMPTOMS  Signs of this condition take time to develop. Sometimes this takes weeks or months. More often, it takes several years.   Early symptoms:  Skin on the palm of the hand becomes thick. This is usually the first sign.  The skin may look dimpled or puckered.  Lumps (nodules) show up on the palm. There may be one or more lumps. They are not painful.  Later symptoms:  Thick cords of tissue form in the palm of the hand.  The pinky and ring fingers start to curl up into the palm.  The fingers cannot be straightened into their normal position. DIAGNOSIS  A physical examination is the main way that a healthcare provider can tell if you have Dupuytren's contracture. Other tests usually are not needed. The caregiver will probably:  Look at your hands. Feel your hands. This is to check for thickening and nodules.  Measure finger motion. This tells how much your fingers have contracted (pulled in).  Do a tabletop test. You will be asked to try to put your hand flat on a table, palm down. TREATMENT  There is no cure for Dupuytren's contracture. But there are ways to treat the symptoms. Options include:  Watching and waiting. The condition develops slowly. Often it does not create problems for a long time. Sometimes the skin gets thick and nodules form, but the fingers never curl. So, in some cases it is best to just watch the condition carefully and wait to see what happens.    Shots (injections). Different substances may be injected, including:  Steroids. These drugs block swelling. These shots should make the condition less uncomfortable. Steroids may also slow down the condition. Shots are given into the nodules. The effect only lasts awhile. More shots may have to be given.  Enzymes. These are proteins. They weaken the thick tissue. After an injection, the caregiver usually stretches the  fingers.  Needling. A needle is pushed through the skin and into the thick tissue. This is done in several spots. The goal is to break up the thickened tissue. Or to weaken it.  Surgery. This may be suggested if you cannot grasp objects. Or, if you can no longer put your hand in your pocket.  A cut (incision) is made in the palm of the hand. The thick tissue is removed.  Sometimes the thick tissue is attached to the skin. Then, the skin must be removed, too. It is replaced with a piece of skin from another place on your body. That is called a skin graft.  Occupational or hand therapy is almost always needed after surgery. This involves special exercises to get back the use of your hand and fingers. After a skin graft, several months of therapy may be needed.  Sometimes the condition comes back, even after surgery.  Other methods. You can do some things on your own. They include:  Stretching the fingers backwards. Do this often.  Warming the hand and massaging it. Again, do this often.  Using tools with padded grips. This should make things easier.  Wearing heavy gloves while working. This protects the hands. PROGNOSIS  Dupuytren's contracture usually develops slowly. There is no cure. But, the symptoms can be treated. Sometimes they come back after treatment, but not always. It is important to remember that this is a functional problem and not a life-threatening condition. Document Released: 04/19/2009 Document Revised: 09/14/2011 Document Reviewed: 04/19/2009 [facility] Patient Information 2014 [place], [place]. Health Maintenance, Males A healthy lifestyle and preventative care can promote health and wellness.  Maintain regular health, dental, and eye exams.  Eat a healthy diet. Foods like vegetables, fruits, whole grains, low-fat dairy products, and lean protein foods contain the nutrients you need without too many calories. Decrease your intake of foods high in solid fats, added  sugars, and salt. Get information about a proper diet from your caregiver, if necessary.  Regular physical exercise is one of the most important things you can do for your health. Most adults should get at least 150 minutes of moderate-intensity exercise (any activity that increases your heart rate and causes you to sweat) each week. In addition, most adults need muscle-strengthening exercises on 2 or more days a week.   Maintain a healthy weight. The body mass index (BMI) is a screening tool to identify possible weight problems. It provides an estimate of body fat based on height and weight. Your caregiver can help determine your BMI, and can help you achieve or maintain a healthy weight. For adults 20 years and older:  A BMI below 18.5 is considered underweight.  A BMI of 18.5 to 24.9 is normal.  A BMI of 25 to 29.9 is considered overweight.  A BMI of 30 and above is considered obese.  Maintain normal blood lipids and cholesterol by exercising and minimizing your intake of saturated fat. Eat a balanced diet with plenty of fruits and vegetables. Blood tests for lipids and cholesterol should begin at age 35 and be repeated every 5 years. If your lipid  or cholesterol levels are high, you are over 50, or you are a high risk for heart disease, you may need your cholesterol levels checked more frequently.Ongoing high lipid and cholesterol levels should be treated with medicines, if diet and exercise are not effective.  If you smoke, find out from your caregiver how to quit. If you do not use tobacco, do not start.  Lung cancer screening is recommended for adults aged 95 80 years who are at high risk for developing lung cancer because of a history of smoking. Yearly low-dose computed tomography (CT) is recommended for people who have at least a 30-pack-year history of smoking and are a current smoker or have quit within the past 15 years. A pack year of smoking is smoking an average of 1 pack of  cigarettes a day for 1 year (for example: 1 pack a day for 30 years or 2 packs a day for 15 years). Yearly screening should continue until the smoker has stopped smoking for at least 15 years. Yearly screening should also be stopped for people who develop a health problem that would prevent them from having lung cancer treatment.  If you choose to drink alcohol, do not exceed 2 drinks per day. One drink is considered to be 12 ounces (355 mL) of beer, 5 ounces (148 mL) of wine, or 1.5 ounces (44 mL) of liquor.  Avoid use of street drugs. Do not share needles with anyone. Ask for help if you need support or instructions about stopping the use of drugs.  High blood pressure causes heart disease and increases the risk of stroke. Blood pressure should be checked at least every 1 to 2 years. Ongoing high blood pressure should be treated with medicines if weight loss and exercise are not effective.  If you are 61 to 51 years old, ask your caregiver if you should take aspirin to prevent heart disease.  Diabetes screening involves taking a blood sample to check your fasting blood sugar level. This should be done once every 3 years, after age 16, if you are within normal weight and without risk factors for diabetes. Testing should be considered at a younger age or be carried out more frequently if you are overweight and have at least 1 risk factor for diabetes.  Colorectal cancer can be detected and often prevented. Most routine colorectal cancer screening begins at the age of 26 and continues through age 55. However, your caregiver may recommend screening at an earlier age if you have risk factors for colon cancer. On a yearly basis, your caregiver may provide home test kits to check for hidden blood in the stool. Use of a small camera at the end of a tube, to directly examine the colon (sigmoidoscopy or colonoscopy), can detect the earliest forms of colorectal cancer. Talk to your caregiver about this at age 47,  when routine screening begins. Direct examination of the colon should be repeated every 5 to 10 years through age 3, unless early forms of pre-cancerous polyps or small growths are found.  Hepatitis C blood testing is recommended for all people born from [birth year] through 1964 and any individual with known risks for hepatitis C.  Healthy men should no longer receive prostate-specific antigen (PSA) blood tests as part of routine cancer screening. Consult with your caregiver about prostate cancer screening.  Testicular cancer screening is not recommended for adolescents or adult males who have no symptoms. Screening includes self-exam, caregiver exam, and other screening tests. Consult with your caregiver  about any symptoms you have or any concerns you have about testicular cancer.  Practice safe sex. Use condoms and avoid high-risk sexual practices to reduce the spread of sexually transmitted infections (STIs).  Use sunscreen. Apply sunscreen liberally and repeatedly throughout the day. You should seek shade when your shadow is shorter than you. Protect yourself by wearing long sleeves, pants, a wide-brimmed hat, and sunglasses year round, whenever you are outdoors.  Notify your caregiver of new moles or changes in moles, especially if there is a change in shape or color. Also notify your caregiver if a mole is larger than the size of a pencil eraser.  A one-time screening for abdominal aortic aneurysm (AAA) and surgical repair of large AAAs by sound wave imaging (ultrasonography) is recommended for ages 49 to 50 years who are current or former smokers.  Stay current with your immunizations. Document Released: 12/19/2007 Document Revised: 10/17/2012 Document Reviewed: 11/17/2010 [facility] Patient Information 2014 [place], [place].

## 2013-06-23 LAB — HSV(HERPES SIMPLEX VRS) I + II AB-IGG
HSV 1 Glycoprotein G Ab, IgG: 0.19 IV
HSV 2 Glycoprotein G Ab, IgG: 0.22 IV

## 2013-06-26 ENCOUNTER — Encounter: Payer: Self-pay | Admitting: Family Medicine

## 2013-07-21 ENCOUNTER — Telehealth: Payer: Self-pay

## 2013-07-21 DIAGNOSIS — N400 Enlarged prostate without lower urinary tract symptoms: Secondary | ICD-10-CM

## 2013-07-21 NOTE — Telephone Encounter (Signed)
Dr. Carlean Jews:  Patient was in to see you in December and says you discussed a referral to an orthopedic and a Urologist and he has only heard from one so far.  I see the referral for the Ortho Dr. But none for Urology.  Please advise patient [phone]. Also, you wrote a note for him being out sick in October and they need a more detailed note for work.  Also, you treated a place on his face with liquid nitrogen and would like to discuss having that done again.

## 2013-07-24 NOTE — Telephone Encounter (Signed)
Do we need to set up referral for urology?

## 2013-07-25 NOTE — Telephone Encounter (Signed)
Tried to rtn call- number is not accessible at this time.   Referral to Urology has been sent in. Need clarification on the note for work. Once we have the details needed ok to write it and have pt pick up.

## 2013-07-27 NOTE — Telephone Encounter (Signed)
LMOM to CB. 

## 2013-07-28 NOTE — Telephone Encounter (Signed)
LM for rtn call. 

## 2013-07-31 NOTE — Telephone Encounter (Signed)
Attempted to call patient, no answer, left message on machine to call back.

## 2013-11-23 ENCOUNTER — Telehealth: Payer: Self-pay

## 2013-11-23 NOTE — Telephone Encounter (Signed)
DR. L - Pt had a CPE with you in December.  He dropped by a [employer] physical form today that needs to be filled out.  Please call him at [phone] when its ready to pick up.  I have placed this in your box.

## 2013-11-24 NOTE — Telephone Encounter (Signed)
Pt form copied/scanned. Pt notified form ready for p/up.

## 2014-06-08 ENCOUNTER — Telehealth: Payer: Self-pay

## 2014-06-08 NOTE — Telephone Encounter (Signed)
Returned call to patient. I will have clinical check immunization registry for immunizations. The only ones we have in his chart at [facility] is tdap and flu shots. Will call patient back this afternoon.

## 2014-06-08 NOTE — Telephone Encounter (Signed)
Spoke with patient. Clinical TL printed immunizations from [employer]. Patient will pick up this evening.

## 2014-06-08 NOTE — Telephone Encounter (Signed)
Patient left voicemail at 9:04 this morning stating that he is going to [place] on Sunday and they need immunization information. Cb# [phone] or [phone]

## 2014-06-25 ENCOUNTER — Encounter: Payer: Self-pay | Admitting: Family Medicine

## 2014-07-19 ENCOUNTER — Ambulatory Visit (INDEPENDENT_AMBULATORY_CARE_PROVIDER_SITE_OTHER): Payer: 59 | Admitting: Physician Assistant

## 2014-07-19 ENCOUNTER — Encounter: Payer: 59 | Admitting: Family Medicine

## 2014-07-19 VITALS — BP 110/70 | HR 65 | Temp 98.2°F | Resp 18 | Ht 67.5 in | Wt 161.4 lb

## 2014-07-19 DIAGNOSIS — J029 Acute pharyngitis, unspecified: Secondary | ICD-10-CM

## 2014-07-19 LAB — POCT RAPID STREP A (OFFICE): RAPID STREP A SCREEN: NEGATIVE

## 2014-07-19 MED ORDER — PENICILLIN G BENZATHINE 1200000 UNIT/2ML IM SUSP
1.2000 10*6.[IU] | Freq: Once | INTRAMUSCULAR | Status: AC
  Administered 2014-07-19: 1.2 10*6.[IU] via INTRAMUSCULAR

## 2014-07-19 MED ORDER — AMOXICILLIN 875 MG PO TABS: 875.0000 mg | ORAL_TABLET | Freq: Two times a day (BID) | ORAL | Status: DC

## 2014-07-19 NOTE — Patient Instructions (Signed)

## 2014-07-19 NOTE — Progress Notes (Signed)
   Subjective:    Patient ID: Adam Myers, male    DOB: [birth year], 54 y.o.   MRN: [id number]  HPI Patient presents with sore throat that has been presents for 2 weeks. Able to swallow and eat, however, is hoarse from time to time. Has intermittent rhinorrhea, congestion, HA, and cough. Denies fever, sinus pressure, or SOB/CP. Wife recently dx with strep pharyngitis. She suggested he use suppositories for pain relief and he endorses they work. Also has used lozenges and chloraseptic. No h/o asthma or allergies. NKDA.   Review of Systems  Constitutional: Negative for fever, chills, activity change, appetite change and fatigue.  HENT: Positive for congestion, rhinorrhea, sinus pressure and sore throat. Negative for ear pain, postnasal drip, sneezing and trouble swallowing.   Eyes: Negative.   Respiratory: Positive for cough. Negative for shortness of breath and wheezing.   Cardiovascular: Negative for chest pain.  Gastrointestinal: Negative for nausea and vomiting.  Musculoskeletal: Negative for neck pain.  Allergic/Immunologic: Negative for environmental allergies and food allergies.  Neurological: Positive for headaches. Negative for dizziness.  Hematological: Negative for adenopathy.       Objective:   Physical Exam  Constitutional: He is oriented to person, place, and time. He appears well-developed and well-nourished. No distress.  Blood pressure 110/70, pulse 65, temperature 98.2 F (36.8 C), temperature source Oral, resp. rate 18, height 5' 7.5" (1.715 m), weight 161 lb 6.4 oz (73.211 kg), SpO2 98 %.  HENT:  Head: Normocephalic and atraumatic.  Right Ear: Tympanic membrane, external ear and ear canal normal.  Left Ear: Tympanic membrane, external ear and ear canal normal.  Nose: Rhinorrhea present. No mucosal edema or sinus tenderness. Right sinus exhibits no maxillary sinus tenderness and no frontal sinus tenderness. Left sinus exhibits no maxillary sinus tenderness and no  frontal sinus tenderness.  Mouth/Throat: Uvula is midline and mucous membranes are normal. Posterior oropharyngeal erythema present. No oropharyngeal exudate, posterior oropharyngeal edema or tonsillar abscesses.  Eyes: Conjunctivae are normal. Pupils are equal, round, and reactive to light. Right eye exhibits no discharge. Left eye exhibits no discharge. No scleral icterus.  Neck: Neck supple. No thyromegaly present.  Cardiovascular: Normal rate, regular rhythm and normal heart sounds.  Exam reveals no gallop and no friction rub.   No murmur heard. Pulmonary/Chest: Effort normal and breath sounds normal. No respiratory distress. He has no decreased breath sounds. He has no wheezes. He has no rhonchi. He has no rales.  Abdominal: Soft. Bowel sounds are normal. There is no tenderness.  Lymphadenopathy:    He has cervical adenopathy.  Neurological: He is alert and oriented to person, place, and time.  Skin: Skin is warm and dry. No rash noted. He is not diaphoretic. No erythema. No pallor.   Results for orders placed or performed in visit on 07/19/14  POCT rapid strep A  Result Value Ref Range   Rapid Strep A Screen Negative Negative       Assessment & Plan:  1. Acute pharyngitis, unspecified pharyngitis type 2. Sore throat Treating empirically as wife as positive strep test and he has children in the home, despite the long duration of illness. Advised to change toothbrush and wash pillow cases.  - POCT rapid strep A - penicillin g benzathine (BICILLIN LA) 1200000 UNIT/2ML injection 1.2 Million Units; Inject 2 mLs (1.2 Million Units total) into the muscle once.    Alveta Heimlich PA-C  [facility] 07/19/2014 10:58 AM

## 2014-07-20 NOTE — Addendum Note (Signed)
Addended byGrant Fontana R on: 07/20/2014 04:14 PM   Modules accepted: Orders

## 2014-07-23 ENCOUNTER — Encounter: Payer: Self-pay | Admitting: Physician Assistant

## 2014-07-23 LAB — CULTURE, GROUP A STREP

## 2014-08-02 ENCOUNTER — Ambulatory Visit (INDEPENDENT_AMBULATORY_CARE_PROVIDER_SITE_OTHER): Payer: 59 | Admitting: Family Medicine

## 2014-08-02 ENCOUNTER — Other Ambulatory Visit: Payer: Self-pay | Admitting: Family Medicine

## 2014-08-02 ENCOUNTER — Encounter: Payer: Self-pay | Admitting: Family Medicine

## 2014-08-02 VITALS — BP 116/74 | HR 61 | Temp 98.3°F | Resp 16 | Ht 67.0 in | Wt 161.0 lb

## 2014-08-02 DIAGNOSIS — Z0000 Encounter for general adult medical examination without abnormal findings: Secondary | ICD-10-CM

## 2014-08-02 LAB — CBC WITH DIFFERENTIAL/PLATELET
Basophils Absolute: 0 10*3/uL (ref 0.0–0.1)
Basophils Relative: 1 % (ref 0–1)
Eosinophils Absolute: 0.1 10*3/uL (ref 0.0–0.7)
Eosinophils Relative: 2 % (ref 0–5)
HCT: 44.9 % (ref 39.0–52.0)
Hemoglobin: 15.3 g/dL (ref 13.0–17.0)
Lymphocytes Relative: 32 % (ref 12–46)
Lymphs Abs: 1.5 10*3/uL (ref 0.7–4.0)
MCH: 30.9 pg (ref 26.0–34.0)
MCHC: 34.1 g/dL (ref 30.0–36.0)
MCV: 90.7 fL (ref 78.0–100.0)
MPV: 9.8 fL (ref 8.6–12.4)
Monocytes Absolute: 0.4 10*3/uL (ref 0.1–1.0)
Monocytes Relative: 8 % (ref 3–12)
Neutro Abs: 2.7 10*3/uL (ref 1.7–7.7)
Neutrophils Relative %: 57 % (ref 43–77)
Platelets: 324 10*3/uL (ref 150–400)
RBC: 4.95 MIL/uL (ref 4.22–5.81)
RDW: 13.4 % (ref 11.5–15.5)
WBC: 4.7 10*3/uL (ref 4.0–10.5)

## 2014-08-02 LAB — POCT URINALYSIS DIPSTICK
Bilirubin, UA: NEGATIVE
Blood, UA: NEGATIVE
Glucose, UA: NEGATIVE
Ketones, UA: NEGATIVE
Leukocytes, UA: NEGATIVE
Nitrite, UA: NEGATIVE
Protein, UA: NEGATIVE
Spec Grav, UA: 1.015
Urobilinogen, UA: 0.2
pH, UA: 5

## 2014-08-02 LAB — COMPREHENSIVE METABOLIC PANEL
ALT: 18 U/L (ref 0–53)
AST: 17 U/L (ref 0–37)
Albumin: 4.2 g/dL (ref 3.5–5.2)
Alkaline Phosphatase: 66 U/L (ref 39–117)
BUN: 12 mg/dL (ref 6–23)
CO2: 26 mEq/L (ref 19–32)
Calcium: 9 mg/dL (ref 8.4–10.5)
Chloride: 104 mEq/L (ref 96–112)
Creat: 0.89 mg/dL (ref 0.50–1.35)
Glucose, Bld: 94 mg/dL (ref 70–99)
Potassium: 4.4 mEq/L (ref 3.5–5.3)
Sodium: 138 mEq/L (ref 135–145)
Total Bilirubin: 0.4 mg/dL (ref 0.2–1.2)
Total Protein: 6.8 g/dL (ref 6.0–8.3)

## 2014-08-02 LAB — LIPID PANEL
Cholesterol: 145 mg/dL (ref 0–200)
HDL: 55 mg/dL (ref >39)
LDL Cholesterol: 82 mg/dL (ref 0–99)
Total CHOL/HDL Ratio: 2.6 Ratio
Triglycerides: 38 mg/dL (ref <150)
VLDL: 8 mg/dL (ref 0–40)

## 2014-08-02 NOTE — Progress Notes (Signed)
   Subjective:    Patient ID: Adam Myers, male    DOB: May 29, 1961, 54 y.o.   MRN: [id number]  HPI    Review of Systems  Constitutional: Negative.   HENT: Negative.   Eyes: Negative.   Respiratory: Negative.   Cardiovascular: Negative.   Gastrointestinal: Negative.   Endocrine: Negative.   Genitourinary: Negative.   Musculoskeletal: Positive for back pain.  Skin: Negative.   Allergic/Immunologic: Negative.   Neurological: Negative.   Hematological: Negative.   Psychiatric/Behavioral: Negative.        Objective:   Physical Exam Normal HEENT with good fundi, slight cupping of disc Normal TM's Normal oral exam, visual inspection and palpation Neck: supple, no adenop, no bruit or thyromegaly Chest:  Clear Heart:  Reg, no murmur, gallop or rub Abdomen:  Soft, nontender with no masses No hernia Normal rectal with normal sized prostate Ext:  Left palmar surgical scar, slight contracture of pinky PIP from old fx Good periph pulses, no edema    Assessment & Plan:   This chart was scribed in my presence and reviewed by me personally.    ICD-9-CM ICD-10-CM   1. Annual physical exam V70.0 Z00.00 CBC with Differential/Platelet     Comprehensive metabolic panel     Lipid panel     PSA     POCT urinalysis dipstick     IFOBT POC (occult bld, rslt in office)     RPR     HIV antibody (with reflex)     GC probe amplification, urine     Signed, [name], MD

## 2014-08-02 NOTE — Progress Notes (Signed)
Patient ID: Adam Myers, male   DOB: [birth year], 54 y.o.   MRN: [id number]  This chart was scribed for Adam Haber, MD by Ladene Artist, ED Scribe. The patient was seen in room 27. Patient's care was started at 10:43 AM.  Patient ID: Adam Myers MRN: [id number], DOB: December 21, 1960, 54 y.o. Date of Encounter: 08/02/2014, 10:55 AM  Primary Physician: Adam Haber, MD  Chief Complaint  Patient presents with   Annual Exam   HPI: 54 y.o. year old male with history below presents for an annual exam. Last annual exam was 06/22/13.  R Eye Pt states that his R eye seems blurry. He suspects that he is "R eye dominant". Pt states that he last had his eyes examined 4 years ago; normal. He was told that he did not need to return unless he experienced visual disturbances.    L Hand Pt states that his L hand has improved greatly. He reports that some scarred tissue was removed.   Lower Back Pain Pt states that he experiences lower back pain every morning upon waking. He states that back pain improves as the day progresses. Pt states that he walks around and stretches when flying. He also takes ASA as needed.   Preventative Maintenance  Last colonoscopy ws 10/28/12.  Immunizations Last tdap was 09/29/12.  Pt is an Emergency planning/management officer; plans to fly until age 54. He has 2 children but divorced his first wife. Pt recently married again; wife lives in [place] and is currently awaiting approval for [employer].   Past Medical History  Diagnosis Date   No pertinent past medical history    Tear, knee, medial meniscus, left, subsequent encounter 12/04/2011     Home Meds: Prior to Admission medications   Not on File   Allergies: No Known Allergies  History   Social History   Marital Status: Single    Spouse Name: N/A    Number of Children: N/A   Years of Education: N/A   Occupational History   Chief Technology Officer    Social History Main Topics   Smoking status: Former Smoker -- 1.00  packs/day    Types: Cigarettes    Quit date: 12/02/2008   Smokeless tobacco: Not on file     Comment: off and on quit years ago   Alcohol Use: 0.6 - 1.2 oz/week    1-2 Not specified per week     Comment: occ   Drug Use: No   Sexual Activity: Yes   Other Topics Concern   Not on file   Social History Narrative   Married. Education:  [employer].  Exercise: Yes.     Review of Systems: Constitutional: negative for chills, fever, night sweats, weight changes, or fatigue  HEENT: negative for vision changes, hearing loss, congestion, rhinorrhea, ST, epistaxis, or sinus pressure Cardiovascular: negative for chest pain or palpitations Respiratory: negative for hemoptysis, wheezing, shortness of breath, or cough Abdominal: negative for abdominal pain, nausea, vomiting, diarrhea, or constipation Msk: +back pain Dermatological: negative for rash Neurologic: negative for headache, dizziness, or syncope All other systems reviewed and are otherwise negative with the exception to those above and in the HPI.   Physical Exam: Blood pressure 116/74, pulse 61, temperature 98.3 F (36.8 C), temperature source Oral, resp. rate 16, height 5\' 7"  (1.702 m), weight 161 lb (73.029 kg), SpO2 97 %., There is no weight on file to calculate BMI. General: Well developed, well nourished, in no acute distress. Head: Normocephalic, atraumatic, eyes without discharge, sclera non-icteric, nares  are without discharge. Bilateral auditory canals clear, TM's are without perforation, pearly grey and translucent with reflective cone of light bilaterally. Oral cavity moist, posterior pharynx without exudate, erythema, peritonsillar abscess, or post nasal drip.  Neck: Supple. No thyromegaly. Full ROM. No lymphadenopathy. Lungs: Clear bilaterally to auscultation without wheezes, rales, or rhonchi. Breathing is unlabored. Heart: RRR with S1 S2. No murmurs, rubs, or gallops appreciated. Abdomen: Soft, non-tender,  non-distended with normoactive bowel sounds. No hepatomegaly. No rebound/guarding. No obvious abdominal masses. Msk:  Strength and tone normal for age. Rectal:  Normal prostate, no masses Extremities/Skin: Warm and dry. No clubbing or cyanosis. No edema. No rashes or suspicious lesions. Neuro: Alert and oriented X 3. Moves all extremities spontaneously. Gait is normal. CNII-XII grossly in tact. Psych:  Responds to questions appropriately with a normal affect.   Labs: Results for orders placed or performed in visit on 08/02/14  POCT urinalysis dipstick  Result Value Ref Range   Color, UA yellow    Clarity, UA clear    Glucose, UA neg    Bilirubin, UA neg    Ketones, UA neg    Spec Grav, UA 1.015    Blood, UA neg    pH, UA 5.0    Protein, UA neg    Urobilinogen, UA 0.2    Nitrite, UA neg    Leukocytes, UA Negative       ASSESSMENT AND PLAN:  54 y.o. year old male with  Annual physical exam  This chart was scribed in my presence and reviewed by me personally.    ICD-9-CM ICD-10-CM   1. Annual physical exam V70.0 Z00.00 CBC with Differential/Platelet     Comprehensive metabolic panel     Lipid panel     PSA     POCT urinalysis dipstick     IFOBT POC (occult bld, rslt in office)     RPR     HIV antibody (with reflex)     GC probe amplification, urine     Signed, Adam Haber, MD   Signed, Adam Haber, MD 08/02/2014 10:55 AM

## 2014-08-03 LAB — GC PROBE AMPLIFICATION, URINE: GC Probe Amp, Urine: NEGATIVE

## 2014-08-03 LAB — PSA: PSA: 2.41 ng/mL (ref <=4.00)

## 2014-08-03 LAB — HIV ANTIBODY (ROUTINE TESTING W REFLEX): HIV 1&2 Ab, 4th Generation: NONREACTIVE

## 2014-08-03 LAB — RPR

## 2014-08-04 LAB — CHLAMYDIA TRACHOMATIS RNA, URINE: Chlamydia, Swab/Urine, PCR: NEGATIVE

## 2014-08-05 LAB — POC HEMOCCULT BLD/STL (HOME/3-CARD/SCREEN)
Card #2 Fecal Occult Blod, POC: NEGATIVE
Card #3 Fecal Occult Blood, POC: NEGATIVE
Fecal Occult Blood, POC: POSITIVE

## 2014-08-05 NOTE — Addendum Note (Signed)
Addended byWalden Field A on: 08/05/2014 04:49 PM   Modules accepted: Orders

## 2014-08-09 ENCOUNTER — Encounter: Payer: Self-pay | Admitting: Family Medicine

## 2014-08-13 ENCOUNTER — Encounter: Payer: Self-pay | Admitting: Radiology

## 2014-08-15 ENCOUNTER — Other Ambulatory Visit: Payer: Self-pay | Admitting: Family Medicine

## 2014-08-15 ENCOUNTER — Telehealth: Payer: Self-pay | Admitting: Family Medicine

## 2014-08-15 DIAGNOSIS — Z0000 Encounter for general adult medical examination without abnormal findings: Secondary | ICD-10-CM

## 2014-08-15 NOTE — Telephone Encounter (Signed)
Spoke to pt, he is aware of results.  He states he has seen Dr. Benson Norway previously in 10/2012 for a colonoscopy and prefers to be seen at the same office.  He understands he is very difficult to reach and would like to have Dr Minerva Areola office call him to schedule his appointment as he will only have to reschedule if [facility] schedules the appointment for him.  Can you please place his referral.

## 2014-08-15 NOTE — Telephone Encounter (Signed)
Spoke with patient. Unable to locate form so he will bring another form tomorrow for Dr. Joseph Art to complete.

## 2014-08-15 NOTE — Telephone Encounter (Signed)
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 Joseph Art. He is unsure where he placed the form and wants [place] to check to see if we have it. CB# [phone].

## 2014-08-17 NOTE — Telephone Encounter (Signed)
Patient dropped off forms for completion. Seen by Dr. Joseph Art in Jan 2016. [place] for another provider to complete since Dr. Carlean Jews is out of town. Cb# [phone]. Forms placed in nurse's box at 102.

## 2014-08-18 NOTE — Telephone Encounter (Signed)
Do you mind doing this for him Adam Myers. Thanks

## 2014-08-18 NOTE — Telephone Encounter (Signed)
This is in your box Dr. Carlean Jews

## 2014-08-18 NOTE — Telephone Encounter (Signed)
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

## 2014-08-18 NOTE — Telephone Encounter (Signed)
Form completed - please fill in address portion.

## 2014-08-19 NOTE — Telephone Encounter (Signed)
LMOM that form is ready for p/u

## 2014-09-18 ENCOUNTER — Telehealth: Payer: Self-pay

## 2014-09-18 NOTE — Telephone Encounter (Signed)
Patient wanted to let Dr. Joseph Art know he has set up his own appointment with Dr Benson Norway on 09/19/14 @ 9:15 am. Patient stated it would be a consult to discuss him having a colonoscopy procedure. He stated he was not sure if he needed a referral to be sent to them or not. Patient feels it would be better if we could do a referral just to cover it for his insurance. Patients call back number is [phone]

## 2014-09-18 NOTE — Telephone Encounter (Signed)
Referral pended, Dr Carlean Jews Juluis Rainier

## 2014-09-19 ENCOUNTER — Other Ambulatory Visit: Payer: Self-pay | Admitting: Family Medicine

## 2014-09-19 DIAGNOSIS — Z1211 Encounter for screening for malignant neoplasm of colon: Secondary | ICD-10-CM

## 2014-09-19 DIAGNOSIS — R195 Other fecal abnormalities: Secondary | ICD-10-CM | POA: Insufficient documentation

## 2014-11-19 ENCOUNTER — Encounter: Payer: Self-pay | Admitting: *Deleted

## 2014-11-19 DIAGNOSIS — R195 Other fecal abnormalities: Secondary | ICD-10-CM

## 2015-04-01 ENCOUNTER — Ambulatory Visit (INDEPENDENT_AMBULATORY_CARE_PROVIDER_SITE_OTHER): Payer: 59 | Admitting: Podiatry

## 2015-04-01 ENCOUNTER — Encounter: Payer: Self-pay | Admitting: Podiatry

## 2015-04-01 VITALS — BP 104/63 | HR 73 | Resp 16 | Ht 66.0 in | Wt 155.0 lb

## 2015-04-01 DIAGNOSIS — M722 Plantar fascial fibromatosis: Secondary | ICD-10-CM

## 2015-04-01 DIAGNOSIS — M79671 Pain in right foot: Secondary | ICD-10-CM | POA: Diagnosis not present

## 2015-04-01 DIAGNOSIS — G5761 Lesion of plantar nerve, right lower limb: Secondary | ICD-10-CM

## 2015-04-01 DIAGNOSIS — G5781 Other specified mononeuropathies of right lower limb: Secondary | ICD-10-CM

## 2015-04-01 NOTE — Progress Notes (Signed)
Subjective:     Patient ID: Adam Myers, male   DOB: [birth year], 54 y.o.   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 Patient was scanned today and did well

## 2015-04-01 NOTE — Progress Notes (Signed)
   Subjective:    Patient ID: Adam Myers, male    DOB: 06/13/1961, 54 y.o.   MRN: [id number]  HPI Patient presents with needing to get new orthotics made today. Old one are wearing out. Pt stated, "has no pain in feet".   Review of Systems  Musculoskeletal: Positive for arthralgias.  All other systems reviewed and are negative.      Objective:   Physical Exam        Assessment & Plan:

## 2015-05-24 ENCOUNTER — Ambulatory Visit: Payer: 59 | Admitting: *Deleted

## 2015-05-24 DIAGNOSIS — M79671 Pain in right foot: Secondary | ICD-10-CM

## 2015-05-24 NOTE — Patient Instructions (Signed)

## 2015-05-24 NOTE — Progress Notes (Signed)
Patient ID: Adam Myers, male   DOB: 03-14-1961, 54 y.o.   MRN: [id number] Patient presents for orthotic pick up.  Verbal and written break in and wear instructions given.  Patient will follow up in 4 weeks if symptoms worsen or fail to improve.

## 2015-06-20 DIAGNOSIS — R52 Pain, unspecified: Secondary | ICD-10-CM

## 2017-09-09 DIAGNOSIS — R5383 Other fatigue: Secondary | ICD-10-CM | POA: Diagnosis not present

## 2017-09-09 DIAGNOSIS — Z0000 Encounter for general adult medical examination without abnormal findings: Secondary | ICD-10-CM | POA: Diagnosis not present

## 2017-09-09 DIAGNOSIS — Z125 Encounter for screening for malignant neoplasm of prostate: Secondary | ICD-10-CM | POA: Diagnosis not present

## 2017-09-09 DIAGNOSIS — Z23 Encounter for immunization: Secondary | ICD-10-CM | POA: Diagnosis not present

## 2017-09-24 ENCOUNTER — Other Ambulatory Visit: Payer: Self-pay | Admitting: Family Medicine

## 2017-09-24 ENCOUNTER — Ambulatory Visit
Admission: RE | Admit: 2017-09-24 | Discharge: 2017-09-24 | Disposition: A | Discharge status: Home / Self Care | Payer: 59 | Source: Ambulatory Visit | Attending: Family Medicine | Admitting: Family Medicine

## 2017-09-24 DIAGNOSIS — R059 Cough, unspecified: Secondary | ICD-10-CM

## 2017-09-24 DIAGNOSIS — R0989 Other specified symptoms and signs involving the circulatory and respiratory systems: Secondary | ICD-10-CM

## 2017-09-24 DIAGNOSIS — R05 Cough: Secondary | ICD-10-CM

## 2017-09-24 DIAGNOSIS — B349 Viral infection, unspecified: Secondary | ICD-10-CM | POA: Diagnosis not present

## 2017-12-01 DIAGNOSIS — Z23 Encounter for immunization: Secondary | ICD-10-CM | POA: Diagnosis not present

## 2018-07-14 DIAGNOSIS — Z23 Encounter for immunization: Secondary | ICD-10-CM | POA: Diagnosis not present

## 2020-01-09 IMAGING — CR DG CHEST 2V
2 series · 2 of 2 positions shown · non-contrast
Comparison: None.

CLINICAL DATA: Productive cough for several weeks

EXAM:
CHEST - 2 VIEW

[w chest pa]
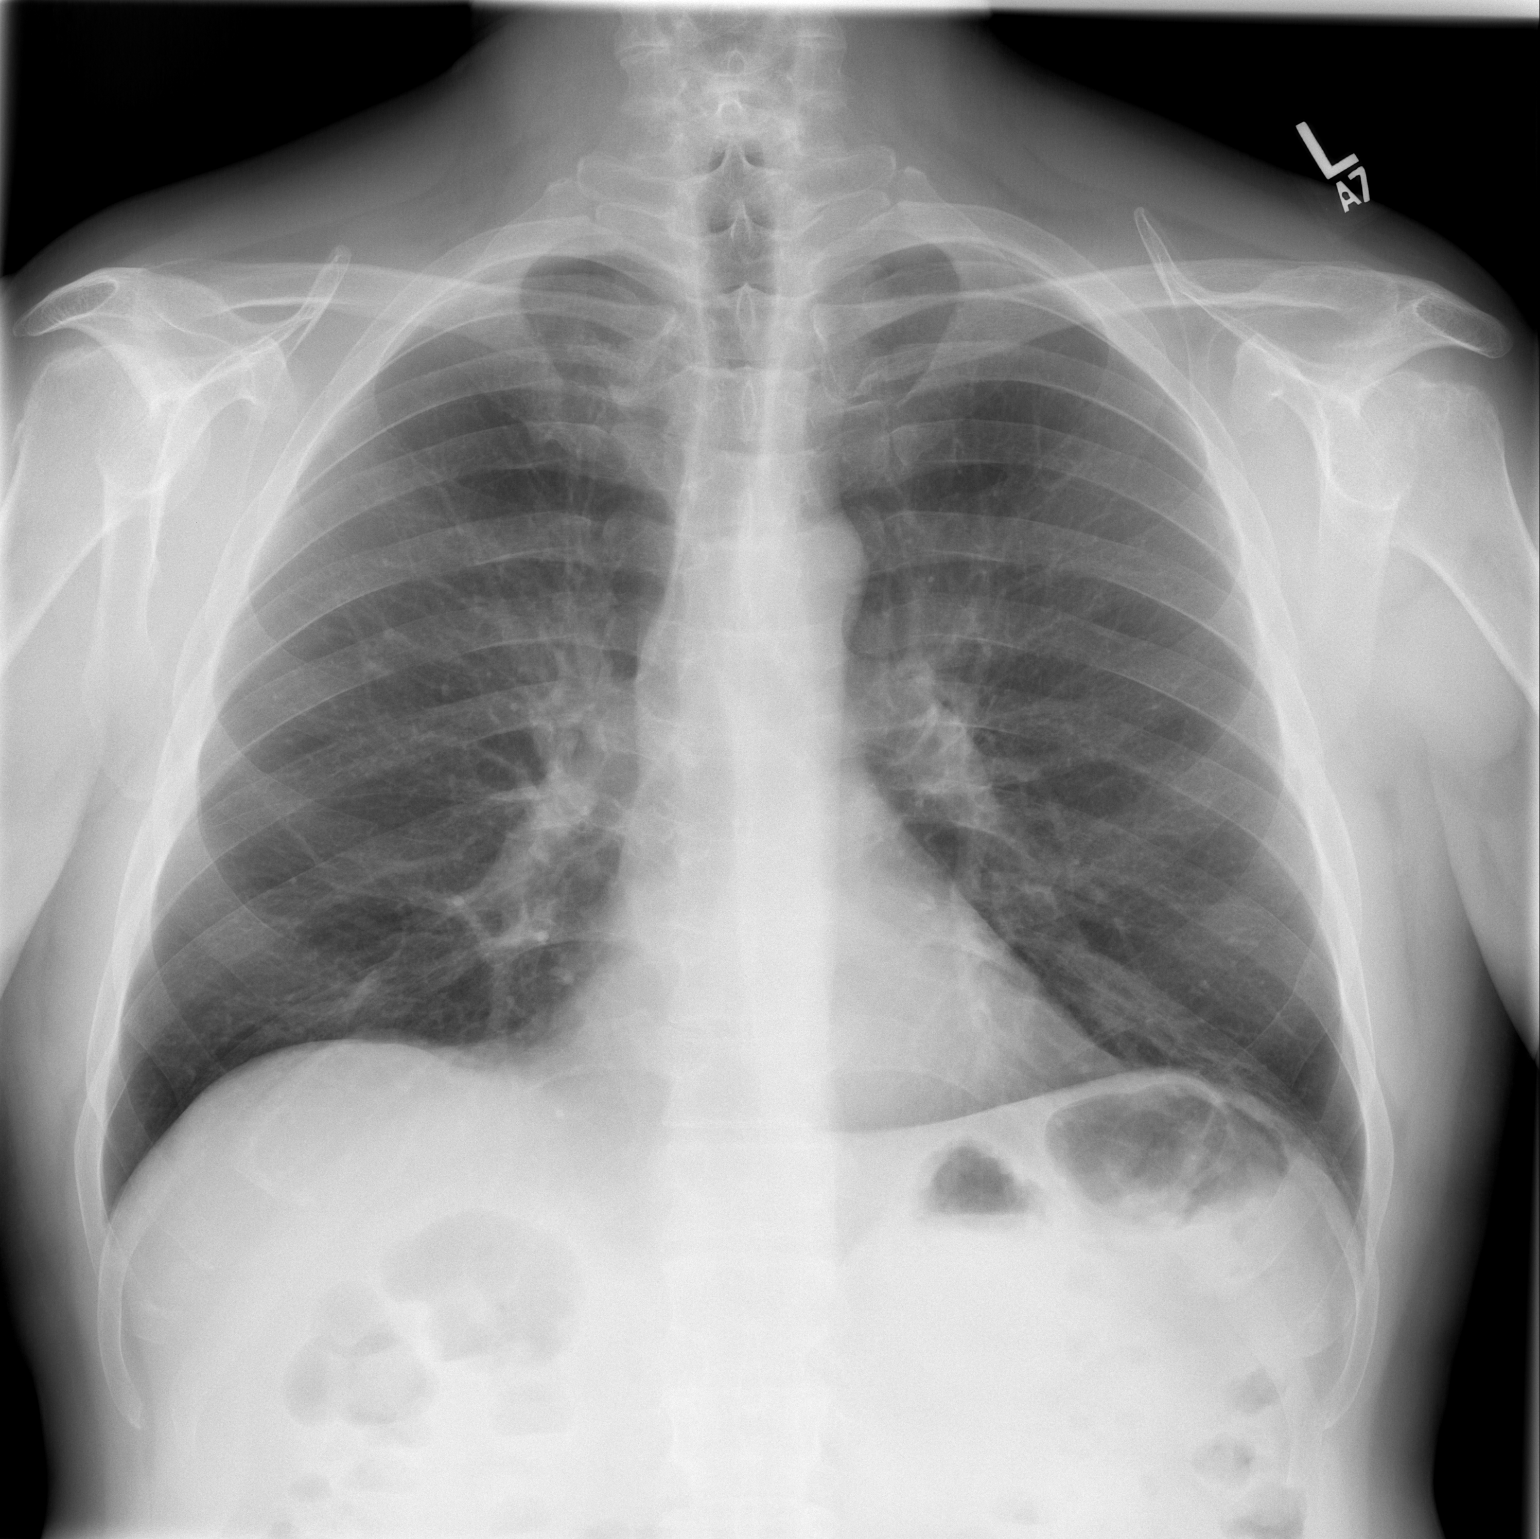

[w chest lat]
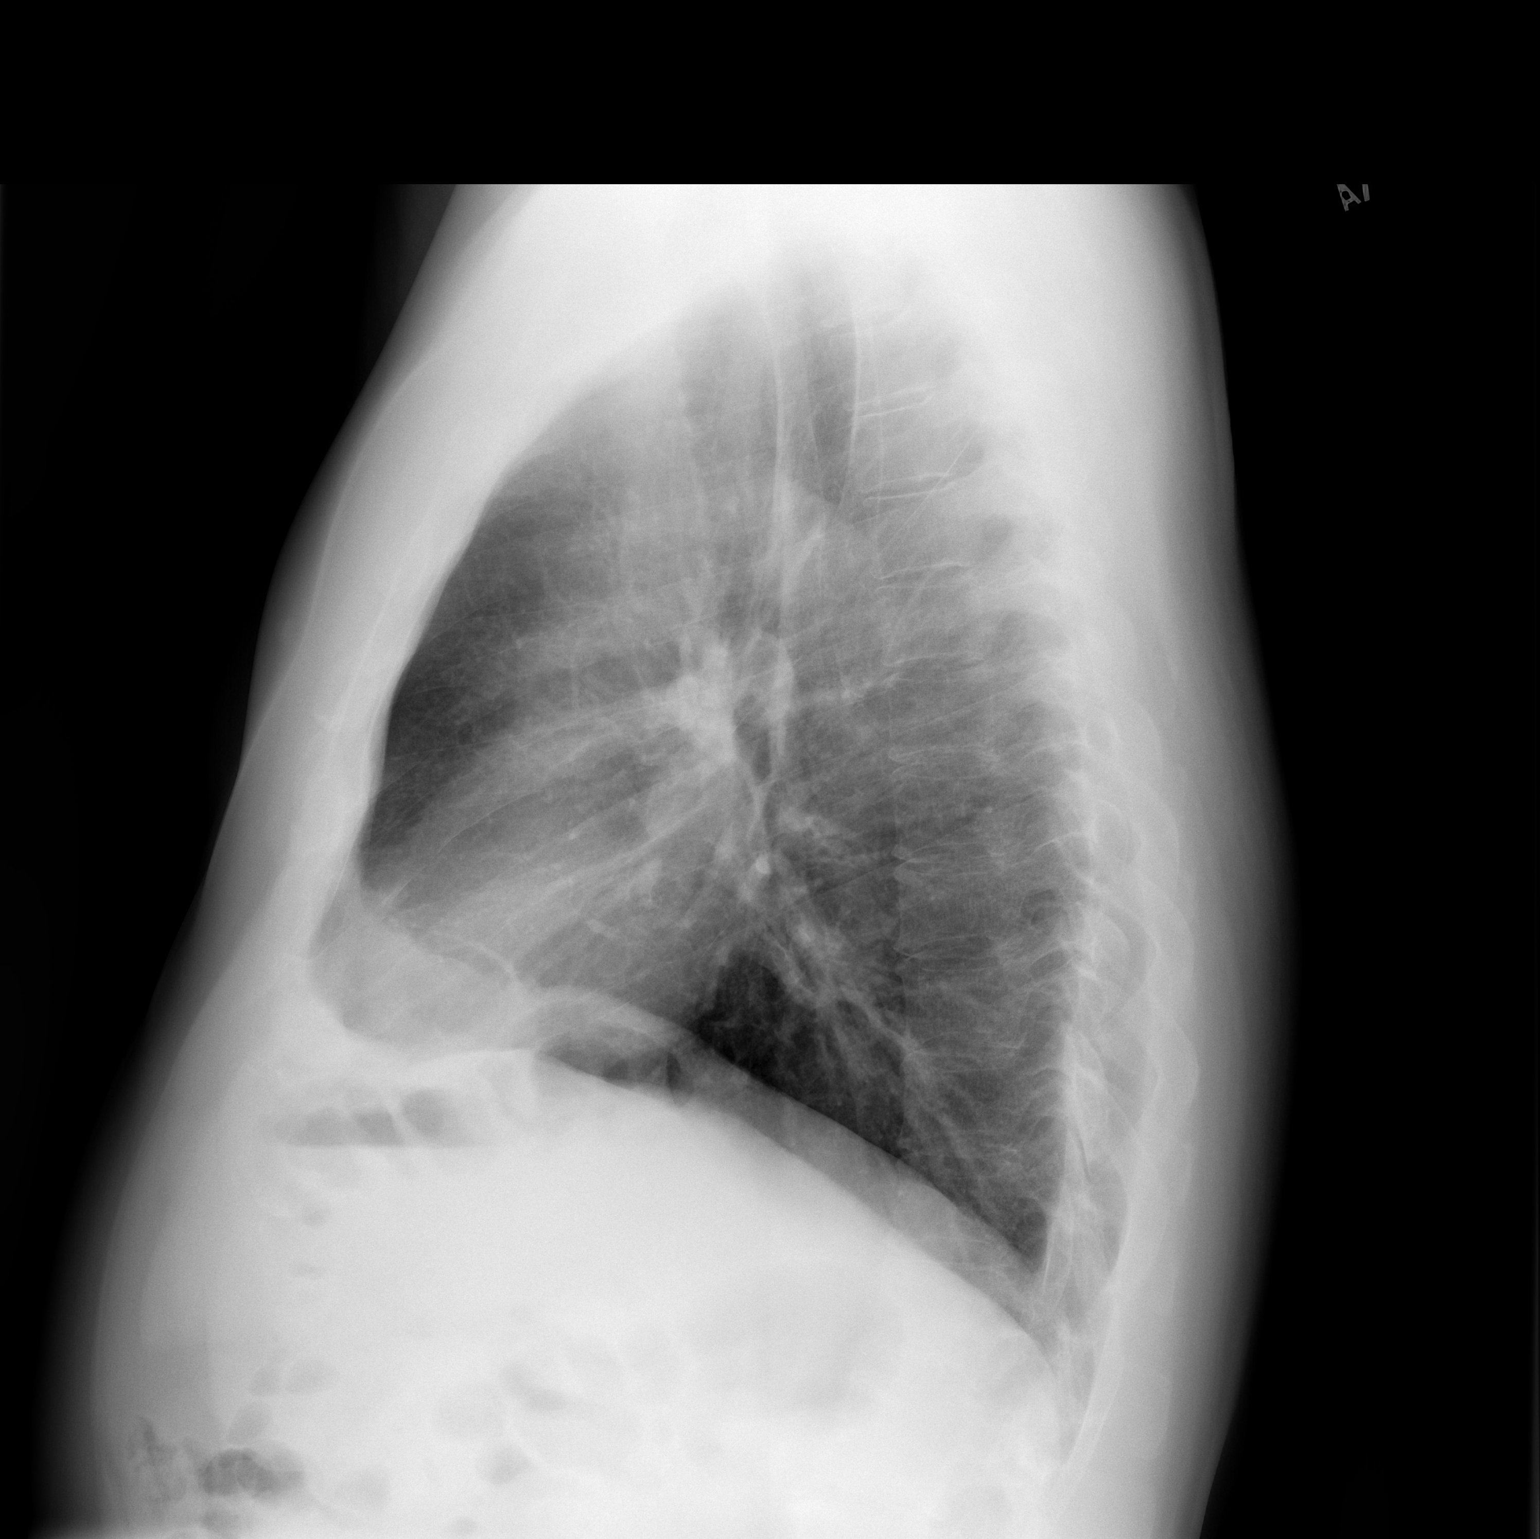

[2 of 2 positions shown; findings below may reference images not displayed]

FINDINGS: The heart size and mediastinal contours are within normal limits.
Both lungs are clear. The visualized skeletal structures are
unremarkable.
IMPRESSION: No active cardiopulmonary disease.
